# Patient Record
Sex: Female | Born: 1955 | Race: White | Hispanic: No | Marital: Married | Smoking: Former smoker
Health system: Southern US, Community
[De-identification: ages and names within clinical notes are randomized; demographics above are authoritative.]

## PROBLEM LIST (undated history)

## (undated) DIAGNOSIS — D649 Anemia, unspecified: Secondary | ICD-10-CM

## (undated) DIAGNOSIS — K625 Hemorrhage of anus and rectum: Secondary | ICD-10-CM

## (undated) DIAGNOSIS — E079 Disorder of thyroid, unspecified: Secondary | ICD-10-CM

## (undated) HISTORY — PX: BREAST LUMPECTOMY: SHX2

## (undated) HISTORY — DX: Hemorrhage of anus and rectum: K62.5

## (undated) HISTORY — PX: COLONOSCOPY: SHX174

## (undated) HISTORY — DX: Disorder of thyroid, unspecified: E07.9

## (undated) HISTORY — DX: Anemia, unspecified: D64.9

---

## 2011-07-14 ENCOUNTER — Encounter: Payer: Self-pay | Admitting: Internal Medicine

## 2011-08-09 ENCOUNTER — Encounter: Payer: Self-pay | Admitting: Internal Medicine

## 2011-08-09 ENCOUNTER — Ambulatory Visit (INDEPENDENT_AMBULATORY_CARE_PROVIDER_SITE_OTHER): Payer: Federal, State, Local not specified - PPO | Admitting: Internal Medicine

## 2011-08-09 VITALS — BP 98/60 | HR 70 | Ht 64.0 in | Wt 121.6 lb

## 2011-08-09 DIAGNOSIS — E039 Hypothyroidism, unspecified: Secondary | ICD-10-CM | POA: Insufficient documentation

## 2011-08-09 DIAGNOSIS — K5150 Left sided colitis without complications: Secondary | ICD-10-CM

## 2011-08-09 MED ORDER — MESALAMINE 1.2 G PO TBEC: 2400.0000 mg | DELAYED_RELEASE_TABLET | Freq: Every day | ORAL | Status: DC

## 2011-08-09 NOTE — Progress Notes (Signed)
Subjective:    Patient ID: Christine George, female    DOB: 03-Jul-1956, 55 y.o.   MRN: [id number]  HPI Christine George is a 55 year old female with a past medical history of proctosigmoiditis diagnosed in 1999 and hypothyroidism who presents for evaluation of bloody stools. Patient states that she feels she is having a flare of her ulcerative colitis which started around March or April 2012. She reports blood in mucus in her stool as well as increased fecal urgency and some tenesmus. She also has some rectal pain and pressure. She is having variable bowel movements as many as 3-4 a day to as many as none for 2 days. She's had no fever or chills. Her appetite has remained good. She's lost no weight.  She's not having any fevers or chills that she is aware of. Her nausea and vomiting. No heartburn, dysphagia, or odynophagia.  No eye pain or change in vision, no nonhealing skin rashes, no new joint pains, no mouth ulcers.  She reports her last GI procedure was in 2009 and that was approximately the time of her last flare (before the current one which started in March 2012).  In the past she has taken mesalamine with good symptom relief. She remembers both taking pills and using a rectal preparation.   Review of Systems Constitutional: Negative for fever, chills, night sweats, activity change, appetite change and unexpected weight change HEENT: Negative for sore throat, mouth sores and trouble swallowing. Eyes: Negative for visual disturbance Respiratory: Negative for cough, chest tightness and shortness of breath Cardiovascular: Negative for chest pain, palpitations and lower extremity swelling Gastrointestinal: See history of present illness Genitourinary: Negative for dysuria and hematuria. Musculoskeletal: Negative for back pain, arthralgias and myalgias Skin: Negative for rash or color change Neurological: Negative for headaches, weakness, numbness Hematological: Negative for adenopathy, negative for easy  bruising/bleeding Psychiatric/behavioral: Negative for depressed mood, negative for anxiety  Past Medical History  Diagnosis Date  . Rectal bleeding   . Anemia   . Thyroid disease   . Ulcerative colitis    Current Outpatient Prescriptions  Medication Sig Dispense Refill  . levothyroxine (SYNTHROID, LEVOTHROID) 125 MCG tablet Take 125 mcg by mouth once a week. Take one every 6 days       . mesalamine (LIALDA) 1.2 G EC tablet Take 2 tablets (2.4 g total) by mouth daily with breakfast.  60 tablet  2   No Known Allergies  Family History  Problem Relation Age of Onset  . Cirrhosis Father   . Heart disease Father   . Heart disease Brother   . Colon cancer Neg Hx     Social History  . Marital Status: Married   Social History Main Topics  . Smoking status: Former Smoker    Quit date: 06/29/1991  . Smokeless tobacco: None  . Alcohol Use: Yes     occassionally  . Drug Use: No      Objective:   Physical Exam BP 98/60  Pulse 70  Ht 5' 4"  (1.626 m)  Wt 121 lb 9.6 oz (55.157 kg)  BMI 20.87 kg/m2  LMP 01/26/2010 Constitutional: Well-developed and well-nourished. No distress. HEENT: Normocephalic and atraumatic. Oropharynx is clear and moist. No oropharyngeal exudate. Conjunctivae are normal. Pupils are equal round and reactive to light. No scleral icterus. Neck: Neck supple. Trachea midline. Cardiovascular: Normal rate, regular rhythm and intact distal pulses. No M/R/G Pulmonary/chest: Effort normal and breath sounds normal. No wheezing, rales or rhonchi. Abdominal: Soft, nontender, nondistended. Bowel sounds active  throughout. There are no masses palpable. No hepatosplenomegaly. Lymphadenopathy: No cervical adenopathy noted. Neurological: Alert and oriented to person place and time. Skin: Skin is warm and dry. No rashes noted. Psychiatric: Normal mood and affect. Behavior is normal.  Prior Procedures: Colonoscopy 08/01/2008 --The mucosa appeared normal from the cecum to  the mid sigmoid. Distal to about 25 cm there was granularity and friability consistent with mild to moderate ALT of colitis. Biopsies were taken in this area. The scope was withdrawn.  Colonoscopy 03/23/1998 The mucosa and vascular pattern appeared normal from the cecum to the rectosigmoid junction. In the distal rectum there was mild to moderate proctitis with granularity, somewhat friable mucosa. There were no gross ulcerations noted. Multiple biopsies were taken.  Pathology report 01/11/2007 Superficial mucosal lymphoplasmacytic aggregates, lymphoid follicles and focal acute inflammation in rectal mucosa, associated with mild crypt architectural irregularity.  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 No organisms, granulomas or viral inclusions are seen. No dysplasia is identified.    Assessment & Plan:  This is a 55 year old female with a past medical history of proctosigmoiditis diagnosed in 1999, and hypothyroidism presenting with symptoms consistent with a flare of her known left-sided colitis.  1. U.C./proctosigmoiditis - overall the patient has mild disease as evidenced by her relatively long gaps without flare and the relative mildness of her current symptoms. Her symptoms are consistent with active disease and therefore I will restart her on mesalamine. We discussed oral versus PR enemas and for now she will prefer oral therapy. She is planning a trip to [place] in early October and would prefer oral therapy for this trip. We may discuss rectal therapy in the future. For now I will start her on Lialda 2.4 g daily (one dose).  I will see her back in 6 weeks to assess response. She should call me should symptoms worsen prior to this visit.  Regarding colorectal cancer surveillance, given that her disease is left-sided the guidelines support beginning surveillance every one to 2 years at  approximately the 15 year mark. For her this would be 2014. For now we will plan surveillance colonoscopy for 2014

## 2011-08-09 NOTE — Patient Instructions (Signed)
Lialda has been sent to your pharmacy. Please follow up in 6-8 weeks

## 2011-08-16 ENCOUNTER — Telehealth: Payer: Self-pay | Admitting: Internal Medicine

## 2011-08-16 MED ORDER — MESALAMINE 4 G RE ENEM: 4.0000 g | ENEMA | Freq: Every day | RECTAL | Status: DC

## 2011-08-16 NOTE — Telephone Encounter (Signed)
lmom for pt to call back

## 2011-08-16 NOTE — Telephone Encounter (Signed)
Informed pt of Dr Vena Rua suggestions. She reports she is already on Lialda 4.8 grams daily- he gave her samples. She agreed to try the enemas for a week and will call back with an update.

## 2011-08-16 NOTE — Telephone Encounter (Signed)
Pt saw Dr Hilarie Fredrickson on 08/09/11 for colitis and was started on Lialda 2.4G daily. Dr Hilarie Fredrickson discussed enemas or po meds and because pt is going out of the country in October, she decided on PO was best. Pt called today to report she has seen "some" improvement, but she leaves soon- a week from Monday- and wants to be better. She reports she still has watery stools that are bloody and urgency- but some improvement. She has an increase in gas also. Please advise.

## 2011-08-16 NOTE — Telephone Encounter (Signed)
Addended by: Lance Morin on: 08/16/2011 03:25 PM   Modules accepted: Orders

## 2011-08-16 NOTE — Telephone Encounter (Signed)
Increase her to 4.8 grams daily. Also, can offer rowasa enemas 4 grams qHS...retain overnight. Would try this to see if she improves further.  The topical rectal therapy might be better at reaching the distal rectum. Finally, if still no improvement could start prednisone taper, but this would be over 6-8 weeks.  Would try increasing lialda and adding rowasa 1st. She should call back if this doesn't work to improve rectal symptoms.

## 2011-08-29 ENCOUNTER — Telehealth: Payer: Self-pay | Admitting: Internal Medicine

## 2011-08-29 MED ORDER — MESALAMINE 4 G RE ENEM: 4.0000 g | ENEMA | Freq: Every day | RECTAL | Status: DC

## 2011-08-29 NOTE — Telephone Encounter (Signed)
Pt called to report she completed the Rowasa enemas last Thursday and her symptoms came back .Pt requests  A refill on the Rowasa just to get her through 09/06/11 when returns from her trip. Informed pt Dr Hilarie Fredrickson she can remain on the Rowasa prn and when she returns she can try to stop the Lialda and remain on the Enemas if necessary. Pt stated understanding.

## 2011-09-15 ENCOUNTER — Encounter: Payer: Self-pay | Admitting: Internal Medicine

## 2011-09-22 ENCOUNTER — Other Ambulatory Visit (INDEPENDENT_AMBULATORY_CARE_PROVIDER_SITE_OTHER): Payer: Federal, State, Local not specified - PPO

## 2011-09-22 ENCOUNTER — Ambulatory Visit (INDEPENDENT_AMBULATORY_CARE_PROVIDER_SITE_OTHER): Payer: Federal, State, Local not specified - PPO | Admitting: Internal Medicine

## 2011-09-22 ENCOUNTER — Ambulatory Visit: Payer: Federal, State, Local not specified - PPO | Admitting: Internal Medicine

## 2011-09-22 ENCOUNTER — Encounter: Payer: Self-pay | Admitting: Internal Medicine

## 2011-09-22 VITALS — BP 100/56 | HR 64 | Ht 63.75 in | Wt 120.8 lb

## 2011-09-22 DIAGNOSIS — K5190 Ulcerative colitis, unspecified, without complications: Secondary | ICD-10-CM

## 2011-09-22 LAB — COMPREHENSIVE METABOLIC PANEL
AST: 23 U/L (ref 0–37)
Alkaline Phosphatase: 71 U/L (ref 39–117)
BUN: 13 mg/dL (ref 6–23)
Glucose, Bld: 77 mg/dL (ref 70–99)
Total Bilirubin: 0.6 mg/dL (ref 0.3–1.2)

## 2011-09-22 LAB — CBC WITH DIFFERENTIAL/PLATELET
Basophils Relative: 0.8 % (ref 0.0–3.0)
Eosinophils Absolute: 0.2 10*3/uL (ref 0.0–0.7)
Eosinophils Relative: 3.6 % (ref 0.0–5.0)
HCT: 30.3 % — ABNORMAL LOW (ref 36.0–46.0)
Lymphs Abs: 1.6 10*3/uL (ref 0.7–4.0)
MCHC: 33 g/dL (ref 30.0–36.0)
MCV: 79.7 fl (ref 78.0–100.0)
Monocytes Absolute: 0.6 10*3/uL (ref 0.1–1.0)
Platelets: 368 10*3/uL (ref 150.0–400.0)
RBC: 3.8 Mil/uL — ABNORMAL LOW (ref 3.87–5.11)
WBC: 6 10*3/uL (ref 4.5–10.5)

## 2011-09-22 LAB — HIGH SENSITIVITY CRP: CRP, High Sensitivity: 0.82 mg/L (ref 0.000–5.000)

## 2011-09-22 LAB — SEDIMENTATION RATE: Sed Rate: 42 mm/hr — ABNORMAL HIGH (ref 0–22)

## 2011-09-22 MED ORDER — MESALAMINE 4 G RE ENEM: 4.0000 g | ENEMA | Freq: Every day | RECTAL | Status: DC

## 2011-09-22 NOTE — Progress Notes (Signed)
Subjective:    Patient ID: Christine George, female    DOB: 1956-08-05, 55 y.o.   MRN: [id number]  HPI 55 yo female with hx of protosigmoiditis since 1999 here for follow-up.  The patient was last seen in September 2012 at that time was having symptoms consistent with UC flare. She was having rectal pain, tenesmus and bloody stools. At that time she was started on Lialda 2.4 g daily as well as Rowasa enemas each bedtime. Initially she had no real improvement with the Lialda, and thus Rowasa was added. The Rowasa seemed to work initially, but then she stopped the medication after about a week. She then traveled to [place], and while there she used Lialda and Rowasa, but this time the Rowasa did not seem to work very well. Thus since around October 7 she has remained only on Lialda 2.4 g daily.  At present she continues to still have rectal "aching", rectal bleeding with her bowel movements, fecal urgency and tenesmus. She reports one to 2 bowel movements on most days. No diarrhea. No anterior abdominal pain or upper symptoms, including no nausea/vomiting. No change in her appetite and no weight loss. No fevers or chills.   Review of Systems Constitutional: Negative for fever, chills, night sweats, activity change, appetite change and unexpected weight change HEENT: Negative for sore throat, mouth sores and trouble swallowing. Eyes: Negative for visual disturbance Respiratory: Negative for cough, chest tightness and shortness of breath Cardiovascular: Negative for chest pain, palpitations and lower extremity swelling Gastrointestinal: See history of present illness Genitourinary: Negative for dysuria and hematuria. Musculoskeletal: Negative for back pain, arthralgias and myalgias Skin: Negative for rash or color change Neurological: Negative for headaches, weakness, numbness Hematological: Negative for adenopathy, negative for easy bruising/bleeding Psychiatric/behavioral: Negative for depressed mood,  negative for anxiety  Patient Active Problem List  Diagnoses  . Ulcerative colitis, left sided  . Hypothyroidism   Current Outpatient Prescriptions  Medication Sig Dispense Refill  . levothyroxine (SYNTHROID, LEVOTHROID) 125 MCG tablet Take 125 mcg by mouth once a week. Take one every 6 days       . mesalamine (ROWASA) 4 G enema Place 60 mLs (4 g total) rectally daily.  30 Bottle  2   No Known Allergies  Family History  Problem Relation Age of Onset  . Cirrhosis Father   . Heart disease Father   . Heart disease Brother   . Colon cancer Neg Hx    SH -- reviewed and no change     Objective:   Physical Exam BP 100/56  Pulse 64  Ht 5' 3.75" (1.619 m)  Wt 120 lb 12.8 oz (54.795 kg)  BMI 20.90 kg/m2  LMP 01/26/2010 Constitutional: Well-developed and well-nourished. No distress. HEENT: Normocephalic and atraumatic. Oropharynx is clear and moist. No oropharyngeal exudate. Conjunctivae are normal. Pupils are equal round and reactive to light. No scleral icterus. Neck: Neck supple. Trachea midline. Cardiovascular: Normal rate, regular rhythm and intact distal pulses. No M/R/G Pulmonary/chest: Effort normal and breath sounds normal. No wheezing, rales or rhonchi. Abdominal: Soft, mild lower abdominal tenderness without rebound or guarding, nondistended. Bowel sounds active throughout. There are no masses palpable. No hepatosplenomegaly. Extremities: no clubbing, cyanosis, or edema Lymphadenopathy: No cervical adenopathy noted. Neurological: Alert and oriented to person place and time. Skin: Skin is warm and dry. No rashes noted. Psychiatric: Normal mood and affect. Behavior is normal.     Assessment & Plan:  This is a 55 year old female with a past medical history of  left-sided ulcers colitis (proctosigmoiditis) presenting for followup with ongoing symptoms of active disease.  1. UC -- in the past the patient's disease has been limited to the left colon (25 cm and distally). She  is not getting much benefit from the oral mesalamine preparation, but did seem to respond to the enema formulation of the medication. I do not think she gave the medication long enough to provide an adequate response.  We have discussed her current flare and options for treatment. We also discussed a prednisone taper to induce remission, but she is hesitant to do this at this time due to possible side effects of this medication. We also briefly discussed other potential therapies for maintenance including immunomodulators therapy and biologic therapy. At this time I do not think her symptoms are severe enough to warrant an escalation to these medications.  For now I will d/c Lialda and restart Rowasa 4 g each bedtime. I would like for her to do this consistently for at least a month before we determine that it is incompletely effective/ineffective.  She will call the office in 2 or 3 weeks to update [place] on her symptoms. If she remains symptomatic I have recommended flexible sigmoidoscopy to assess the degree/severity of her colitis. If she fails to respond to 5-ASA alone, then she will likely need prednisone taper. I will schedule clinic followup for 6 weeks, with the possibility of flexible sigmoidoscopy sooner.

## 2011-09-22 NOTE — Patient Instructions (Signed)
Please go downstairs to the basement after your appointment to have your labs drawn. Your prescription has been sent to your pharmacy - take as directed and discontinue the Lialda. Call Caren Griffins in 2-3 weeks to let her know if you have improved. If so, follow up in 6 weeks.

## 2011-09-26 ENCOUNTER — Telehealth: Payer: Self-pay | Admitting: *Deleted

## 2011-09-26 NOTE — Telephone Encounter (Signed)
Message copied by Lance Morin on Mon Sep 26, 2011 10:47 AM ------      Message from: Jerene Bears      Created: Thu Sep 22, 2011  1:31 PM       Please inform patient of lab results.      Patient has a mild anemia, which is likely related to her rectal bleeding. Hemoglobin is not a dangerous level but we will follow this. Hopefully with treatment of her colitis her bleeding will stop and her blood counts will normalize.      Her ESR is elevated and this is consistent with ongoing inflammation. I discussed with her this can be used to follow her disease activity. We will proceed with plan as discussed today in clinic

## 2011-09-26 NOTE — Telephone Encounter (Signed)
Notified pt of Dr Vena Rua findings. Pt stated understanding.

## 2011-10-31 ENCOUNTER — Encounter: Payer: Self-pay | Admitting: Internal Medicine

## 2011-10-31 ENCOUNTER — Telehealth: Payer: Self-pay | Admitting: Internal Medicine

## 2011-10-31 NOTE — Telephone Encounter (Signed)
Pt has been on Rowasa for a month and is no better.  She does not want to have a colon or flex.  She says Dr Hilarie Fredrickson spoke with her about prednisone and would like to try this first.  Is this ok?

## 2011-10-31 NOTE — Telephone Encounter (Signed)
Patty I really think she needs a flex sig to assess severity given her lack of response to 5-asa.  I have never performed a procedure on her and therefore need to be sure of what we are dealing with. Pred may in fact be what she needs, but I think flex is 1st step (can be sedated or unsedated).  Reg prep. Thanks.

## 2011-10-31 NOTE — Telephone Encounter (Signed)
Pt aware previst and flex scheduled

## 2011-10-31 NOTE — Telephone Encounter (Signed)
Addendum to route to Christian Mate.

## 2011-11-08 ENCOUNTER — Encounter: Payer: Self-pay | Admitting: Internal Medicine

## 2011-11-08 ENCOUNTER — Ambulatory Visit (AMBULATORY_SURGERY_CENTER): Payer: Federal, State, Local not specified - PPO

## 2011-11-08 VITALS — Ht 64.0 in | Wt 125.2 lb

## 2011-11-08 DIAGNOSIS — K5190 Ulcerative colitis, unspecified, without complications: Secondary | ICD-10-CM

## 2011-11-11 ENCOUNTER — Ambulatory Visit (AMBULATORY_SURGERY_CENTER): Payer: Federal, State, Local not specified - PPO | Admitting: Internal Medicine

## 2011-11-11 ENCOUNTER — Other Ambulatory Visit (INDEPENDENT_AMBULATORY_CARE_PROVIDER_SITE_OTHER): Payer: Federal, State, Local not specified - PPO

## 2011-11-11 ENCOUNTER — Encounter: Payer: Self-pay | Admitting: Internal Medicine

## 2011-11-11 ENCOUNTER — Other Ambulatory Visit: Payer: Self-pay | Admitting: Internal Medicine

## 2011-11-11 DIAGNOSIS — K5190 Ulcerative colitis, unspecified, without complications: Secondary | ICD-10-CM

## 2011-11-11 DIAGNOSIS — E039 Hypothyroidism, unspecified: Secondary | ICD-10-CM

## 2011-11-11 DIAGNOSIS — K529 Noninfective gastroenteritis and colitis, unspecified: Secondary | ICD-10-CM

## 2011-11-11 DIAGNOSIS — K5289 Other specified noninfective gastroenteritis and colitis: Secondary | ICD-10-CM

## 2011-11-11 LAB — IBC PANEL
Iron: 32 ug/dL — ABNORMAL LOW (ref 42–145)
Saturation Ratios: 7 % — ABNORMAL LOW (ref 20.0–50.0)
Transferrin: 327.1 mg/dL (ref 212.0–360.0)

## 2011-11-11 LAB — CBC WITH DIFFERENTIAL/PLATELET
Basophils Absolute: 0 10*3/uL (ref 0.0–0.1)
Eosinophils Absolute: 0.1 10*3/uL (ref 0.0–0.7)
Hemoglobin: 10 g/dL — ABNORMAL LOW (ref 12.0–15.0)
Lymphocytes Relative: 22.5 % (ref 12.0–46.0)
Lymphs Abs: 1.5 10*3/uL (ref 0.7–4.0)
MCHC: 33.2 g/dL (ref 30.0–36.0)
Neutro Abs: 4.6 10*3/uL (ref 1.4–7.7)
Platelets: 296 10*3/uL (ref 150.0–400.0)
RDW: 16 % — ABNORMAL HIGH (ref 11.5–14.6)

## 2011-11-11 LAB — VITAMIN B12: Vitamin B-12: 589 pg/mL (ref 211–911)

## 2011-11-11 LAB — FOLATE: Folate: >24.8 ng/mL (ref >5.9)

## 2011-11-11 MED ORDER — MESALAMINE 800 MG PO TBEC: 2.0000 | DELAYED_RELEASE_TABLET | Freq: Three times a day (TID) | ORAL | Status: DC

## 2011-11-11 MED ORDER — SODIUM CHLORIDE 0.9 % IV SOLN: 500.0000 mL | INTRAVENOUS | Status: DC

## 2011-11-11 MED ORDER — MESALAMINE 400 MG PO TBEC: 1600.0000 mg | DELAYED_RELEASE_TABLET | Freq: Three times a day (TID) | ORAL | Status: DC

## 2011-11-11 MED ORDER — PREDNISONE 10 MG PO TABS: ORAL_TABLET | ORAL | Status: DC

## 2011-11-11 NOTE — Progress Notes (Signed)
Patient did not experience any of the following events: a burn prior to discharge; a fall within the facility; wrong site/side/patient/procedure/implant event; or a hospital transfer or hospital admission upon discharge from the facility. (G8907) Patient did not have preoperative order for IV antibiotic SSI prophylaxis. (G8918)  

## 2011-11-11 NOTE — Op Note (Signed)
[facility]. [place],   [id number]  FLEXIBLE SIGMOIDOSCOPY PROCEDURE REPORT  PATIENT:  Christine, George  MR#:  [id number] BIRTHDATE:  1956-04-02, 55 yrs. old  GENDER:  female ENDOSCOPIST:  Lajuan Lines. Kaylie Ritter, MD  PROCEDURE DATE:  11/11/2011 PROCEDURE:  Flexible Sigmoidoscopy with biopsy ASA CLASS:  Class I INDICATIONS:  assess known proctosigmoiditis (UC), rectal bleeding  MEDICATIONS:   These medications were titrated to patient response per physician's verbal order, Versed 6 mg IV, Fentanyl 50 mcg IV  DESCRIPTION OF PROCEDURE:   After the risks benefits and alternatives of the procedure were thoroughly explained, informed consent was obtained.  Digital rectal exam was performed and revealed no rectal masses.   The [id number] endoscope was introduced through the anus and advanced to the descending colon, without limitations.  The quality of the prep was adequate. The instrument was then slowly withdrawn as the mucosa was fully examined. <<PROCEDUREIMAGES>>  Moderate colitis, characterized by erythema, friability, granularity, superficial ulceration, and loss of normal vascular pattern was found rectum to extending to the descending colon. The inflammation was most notable in the rectum and sigmoid colon, while being mild in the descending colon. Multiple biopsies were obtained and sent to pathology.   Retroflexed views in the rectum was not performed due to inflammation.  The scope was then withdrawn from the patient and the procedure terminated.  COMPLICATIONS:  None  ENDOSCOPIC IMPRESSION: 1) Colitis in the rectum to descending colon (entire examined colon).  Multiple biopsies performed.  RECOMMENDATIONS: 1) Await biopsy results 2) Begin prednisone 40 mg daily for 1 week, then taper by 5 mg every 7 days thereafter. 3) Given extent of inflammation, I recommend discontinuing Rowasa enemas, and starting Asacol (mesalamine) 1600 mg TID. 4)  Clinic followup in 1 month to assess response/symptoms.  Lajuan Lines. Landrum Carbonell, MD  CC:  The Patient  n. eSIGNED:   Lajuan Lines. Katelen Luepke at 11/11/2011 02:32 PM  [name], [phone]

## 2011-11-11 NOTE — Patient Instructions (Signed)
Please refer to your blue and neon green sheets for instructions regarding diet and activity for the rest of today.  You may resume your medications as you would normally take them.  

## 2011-11-14 ENCOUNTER — Encounter: Payer: Self-pay | Admitting: *Deleted

## 2011-11-14 ENCOUNTER — Telehealth: Payer: Self-pay

## 2011-11-14 NOTE — Telephone Encounter (Signed)
Left message on answering machine. 

## 2011-11-15 ENCOUNTER — Telehealth: Payer: Self-pay | Admitting: *Deleted

## 2011-11-15 MED ORDER — INTEGRA PLUS PO CAPS: 1.0000 | ORAL_CAPSULE | Freq: Every day | ORAL | Status: DC

## 2011-11-15 NOTE — Telephone Encounter (Signed)
Message copied by Lance Morin on Tue Nov 15, 2011 10:31 AM ------      Message from: Jerene Bears      Created: Tue Nov 15, 2011  8:23 AM       Pt has mild anemia.  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      Would rec Integra x 12 weeks.

## 2011-11-15 NOTE — Telephone Encounter (Signed)
lmom for pt to call back. Ordered Integra Plus from [facility].

## 2011-11-15 NOTE — Telephone Encounter (Signed)
Informed pt I ordered the Integra for her low Iron stores per Dr Hilarie Fredrickson. She stated the Asacol co pay was too high- $250/month. Mailed pt a discount card for Asacol HD and Lialda in case it is cheaper.

## 2011-11-18 ENCOUNTER — Encounter: Payer: Self-pay | Admitting: Internal Medicine

## 2011-12-02 ENCOUNTER — Encounter: Payer: Self-pay | Admitting: Internal Medicine

## 2011-12-13 ENCOUNTER — Encounter: Payer: Self-pay | Admitting: Internal Medicine

## 2011-12-13 ENCOUNTER — Ambulatory Visit (INDEPENDENT_AMBULATORY_CARE_PROVIDER_SITE_OTHER): Payer: Federal, State, Local not specified - PPO | Admitting: Internal Medicine

## 2011-12-13 VITALS — BP 104/64 | HR 70 | Ht 63.6 in | Wt 124.0 lb

## 2011-12-13 DIAGNOSIS — K5190 Ulcerative colitis, unspecified, without complications: Secondary | ICD-10-CM

## 2011-12-13 MED ORDER — MESALAMINE ER 0.375 G PO CP24: 375.0000 mg | ORAL_CAPSULE | Freq: Every day | ORAL | Status: DC

## 2011-12-13 NOTE — Progress Notes (Signed)
Subjective:    Patient ID: Christine George, female    DOB: 1956-03-12, 56 y.o.   MRN: [id number]  HPI 56 yo female with PMH of UC and hypothyroidism who presents for follow-up of UC.  [name] presents alone today. She reports dramatic improvement in her colitis symptoms since starting the prednisone taper in mid December. In December she underwent a flexible sigmoidoscopy which revealed left-sided mild to moderate ulcerative colitis, extending to the splenic flexure. 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  She failed to respond to topical mesalamine preparations alone both oral and PR. She is currently weaned down the prednisone 20 mg daily. Today she reports resolution of her bloody diarrhea. No abdominal pain. No more mucus in her stool. No further rectal pain or tenesmus. In fact, she reports she has been somewhat constipated and has been using senna when necessary. She was prescribed oral iron replacement given her iron deficiency, and she decreased this to every other day given her constipation. She reports that she's only been using Asacol HD 1.6 grams qHS rather than TID.  She simply states she was just confused as to how she was supposed to take this. She has noted some difficulty in paying for this medication. She has recently started an exercise program and is eating a balanced diet. Overall she is feeling very well today. No fevers or chills.  Review of Systems Constitutional: Negative for fever, chills, night sweats, activity change, appetite change and unexpected weight change HEENT: Negative for sore throat, mouth sores and trouble swallowing. Eyes: Negative for visual disturbance Respiratory: Negative for cough, chest tightness and shortness of breath Cardiovascular: Negative for chest pain, palpitations and lower extremity swelling Gastrointestinal: See history of present illness Genitourinary: Negative for dysuria and  hematuria. Musculoskeletal: Negative for back pain, arthralgias and myalgias Skin: Negative for rash or color change Neurological: Negative for headaches, weakness, numbness Hematological: Negative for adenopathy, negative for easy bruising/bleeding Psychiatric/behavioral: Negative for depressed mood, negative for anxiety  Patient Active Problem List  Diagnoses  . Ulcerative colitis, left sided  . Hypothyroidism   Past Surgical History  Procedure Date  . Breast lumpectomy     right  . Cesarean section   . Colonoscopy    Current Outpatient Prescriptions  Medication Sig Dispense Refill  . calcium carbonate 200 MG capsule Take 250 mg by mouth 2 (two) times daily with a meal.        . cholecalciferol (VITAMIN D) 400 UNITS TABS Take by mouth daily.        Marland Kitchen FeFum-FePoly-FA-B Cmp-C-Biot (INTEGRA PLUS) CAPS Take 1 capsule by mouth daily.  30 capsule  4  . levothyroxine (SYNTHROID, LEVOTHROID) 125 MCG tablet Take 125 mcg by mouth. Take one every 6 days a week      . Multiple Vitamin (MULTIVITAMIN) tablet Take 1 tablet by mouth daily.        . predniSONE (DELTASONE) 10 MG tablet 40 mg x 7 d, then 35 mg x 7 d, then 30 mg x 7 d, then 25 mg x 7 d, then 20 mg x 7 d, then 15 mg x 7 d, then 10 mg x 7 d, then 5 mg x 7 d  130 tablet  0  . valACYclovir (VALTREX) 1000 MG tablet       . mesalamine (APRISO) 0.375 G 24 hr capsule Take 1 capsule (0.375 g total) by mouth daily.  120 capsule  5   No Known  Allergies  Family History  Problem Relation Age of Onset  . Cirrhosis Father   . Heart disease Father   . Heart disease Brother   . Colon cancer Neg Hx    SH - reviewed and no change    Objective:   Physical Exam BP 104/64  Pulse 70  Ht 5' 3.6" (1.615 m)  Wt 124 lb (56.246 kg)  BMI 21.55 kg/m2  LMP 01/26/2010 Constitutional: Well-developed and well-nourished. No distress. HEENT: Normocephalic and atraumatic. Oropharynx is clear and moist. No oropharyngeal exudate. Conjunctivae are normal.  Pupils are equal round and reactive to light. No scleral icterus. Cardiovascular: Normal rate, regular rhythm and intact distal pulses. No M/R/G Pulmonary/chest: Effort normal and breath sounds normal. No wheezing, rales or rhonchi. Abdominal: Soft, nontender, nondistended. Bowel sounds active throughout. There are no masses palpable. No hepatosplenomegaly. Extremities: no clubbing, cyanosis, or edema Neurological: Alert and oriented to person place and time. Skin: Skin is warm and dry. No rashes noted. Psychiatric: Normal mood and affect. Behavior is normal.  CBC    Component Value Date/Time   WBC 6.8 11/11/2011 1524   RBC 3.87 11/11/2011 1524   HGB 10.0* 11/11/2011 1524   HCT 30.1* 11/11/2011 1524   PLT 296.0 11/11/2011 1524   MCV 77.7* 11/11/2011 1524   MCHC 33.2 11/11/2011 1524   RDW 16.0* 11/11/2011 1524   LYMPHSABS 1.5 11/11/2011 1524   MONOABS 0.6 11/11/2011 1524   EOSABS 0.1 11/11/2011 1524   BASOSABS 0.0 11/11/2011 1524   Ferritin 5.7, % sat and iron levels low (Dec 2012)    Assessment & Plan:  This is a 57 year old female with left-sided ulcerative colitis which was unresponsive to 5-ASA alone requiring prednisone taper.  1. Left-sided UC -- for now it appears that she is in complete clinical remission on steroids. She has been able to wean down successfully to 20 mg daily, and she will continue to do so by 5 mg every 7 days until she is off. At present she is not taking her mesalamine completely correctly, and I would like for her to increase to Asacol 800 mg TID.  She will complete the prescription that she currently has, and I will change her to Apriso 4 tablets daily.  This medication is indicated for maintenance of remission and also colitis, and therefore she will start this now. We have discussed how I am unsure if after weaning from steroids, she will be able to maintain remission with mesalamine alone. If not, she will need additional maintenance medication. We  briefly discussed the immunomodulator therapy and biologic therapy today. If she does indeed need additional medication to maintain remission, my next option will likely be azathioprine. She continues to feel hopeful that God will help maintain remission for her, and at present she would not like to start immunomodulator therapy.  If her symptoms flare or return after she is weaned off prednisone, then we will initiate/escalate therapy.  I would like for her to continue on her oral iron replacement therapy. I will see her back in 3 months time, or sooner if needed. At followup I would like to check her blood counts and iron stores.  If azathioprine becomes necessary, she will need a TPMT level.

## 2011-12-13 NOTE — Patient Instructions (Addendum)
Dr. Hilarie Fredrickson would like you to complete taking Azocol then switch to Apriso.  Stop taking Senna and switch to Miralax OTC  Dr. Hilarie Fredrickson would like to see you back in the office in 3 months for a follow up.

## 2012-05-23 ENCOUNTER — Encounter: Payer: Self-pay | Admitting: Internal Medicine

## 2012-08-16 ENCOUNTER — Encounter: Payer: Self-pay | Admitting: Internal Medicine

## 2012-08-17 ENCOUNTER — Encounter: Payer: Self-pay | Admitting: Internal Medicine

## 2012-08-17 ENCOUNTER — Ambulatory Visit (INDEPENDENT_AMBULATORY_CARE_PROVIDER_SITE_OTHER): Payer: Federal, State, Local not specified - PPO | Admitting: Internal Medicine

## 2012-08-17 ENCOUNTER — Other Ambulatory Visit (INDEPENDENT_AMBULATORY_CARE_PROVIDER_SITE_OTHER): Payer: Federal, State, Local not specified - PPO

## 2012-08-17 VITALS — BP 84/56 | HR 64 | Ht 63.75 in | Wt 124.1 lb

## 2012-08-17 DIAGNOSIS — K5190 Ulcerative colitis, unspecified, without complications: Secondary | ICD-10-CM

## 2012-08-17 DIAGNOSIS — E611 Iron deficiency: Secondary | ICD-10-CM | POA: Insufficient documentation

## 2012-08-17 DIAGNOSIS — D509 Iron deficiency anemia, unspecified: Secondary | ICD-10-CM

## 2012-08-17 LAB — CBC
Hemoglobin: 12.3 g/dL (ref 12.0–15.0)
MCHC: 33.3 g/dL (ref 30.0–36.0)
MCV: 91.2 fl (ref 78.0–100.0)
Platelets: 240 10*3/uL (ref 150.0–400.0)
RBC: 4.05 Mil/uL (ref 3.87–5.11)

## 2012-08-17 LAB — IBC PANEL: Saturation Ratios: 19.2 % — ABNORMAL LOW (ref 20.0–50.0)

## 2012-08-17 MED ORDER — MESALAMINE ER 0.375 G PO CP24: 375.0000 mg | ORAL_CAPSULE | Freq: Every day | ORAL | Status: DC

## 2012-08-17 NOTE — Progress Notes (Signed)
  Subjective:    Patient ID: Christine George, female    DOB: 07-27-1956, 56 y.o.   MRN: [id number]  HPI Christine George is a 56 yo female with PMH of UC and hypothyroidism who presents for follow-up of UC. Christine George presents alone today. She was last seen in January 2013.  Since that time she's been doing very well without recurrent flare of her left-sided colitis. She has been maintained on Apriso 4 capsules daily.  She reports she takes these almost every day, but admits to missing them on occasion. She says she takes them roughly every day to day and a half.  She is having formed brown stool with no rectal bleeding. No diarrhea. No abdominal pain. No fevers or chills. No rectal pain or tenesmus.she did take iron for one to 2 months but has stopped. Energy level has been good. Since last seeing me, her mother died, and she has been working to deal with this.  Review of Systems As per history of present illness, otherwise negative  Current Medications, Allergies, Past Medical History, Past Surgical History, Family History and Social History were reviewed in [facility] record.    Objective:   Physical Exam BP 84/56  Pulse 64  Ht 5' 3.75" (1.619 m)  Wt 124 lb 2 oz (56.303 kg)  BMI 21.47 kg/m2  LMP 01/26/2010 Constitutional: Well-developed and well-nourished. No distress. HEENT: Normocephalic and atraumatic. Oropharynx is clear and moist. No oropharyngeal exudate. Conjunctivae are normal.  No scleral icterus.. Cardiovascular: Normal rate, regular rhythm and intact distal pulses. No M/R/G Pulmonary/chest: Effort normal and breath sounds normal. No wheezing, rales or rhonchi. Abdominal: Soft, nontender, nondistended. Bowel sounds active throughout. There are no masses palpable. No hepatosplenomegaly. Extremities: no clubbing, cyanosis, or edema Neurological: Alert and oriented to person place and time. Skin: Skin is warm and dry. No rashes noted. Psychiatric: Normal mood and affect.  Behavior is normal.  Labs -- iron studies and CBC ordered today    Assessment & Plan:   56 yo female with PMH of UC and hypothyroidism who presents for follow-up of left-sided UC.  1. Left-sided ulcerative colitis -- she has done well on her mesalamine preparation without flare in nearly 8 months. She has only needed steroids once since establishing care with me. We had previously discussed azathioprine or biologic therapy, but at this time given her response to 5-ASA, I do not think this is necessary. For now we'll continue mesalamine at her current dose, and I've encouraged her to try to take this daily. She will notify us should she develop signs or symptoms of active disease. Otherwise I'll see her back in about one year.  2.  Iron def anemia -- she had taken oral iron for a short period, and hopefully now that her colitis is under control her anemia and iron deficiency will improve. We will check CBC and iron stores today, and if still low plan oral iron supplementation.

## 2012-08-17 NOTE — Patient Instructions (Addendum)
We have sent the following medications to your pharmacy for you to pick up at your convenience: [facility] has requested that you go to the basement for the following lab work before leaving today:  CBC, Ferritin, IBC

## 2012-12-24 ENCOUNTER — Encounter: Payer: Self-pay | Admitting: Internal Medicine

## 2013-01-03 ENCOUNTER — Encounter: Payer: Self-pay | Admitting: Internal Medicine

## 2013-01-08 ENCOUNTER — Other Ambulatory Visit (INDEPENDENT_AMBULATORY_CARE_PROVIDER_SITE_OTHER): Payer: Federal, State, Local not specified - PPO

## 2013-01-08 ENCOUNTER — Encounter: Payer: Self-pay | Admitting: Internal Medicine

## 2013-01-08 ENCOUNTER — Ambulatory Visit (INDEPENDENT_AMBULATORY_CARE_PROVIDER_SITE_OTHER): Payer: Federal, State, Local not specified - PPO | Admitting: Internal Medicine

## 2013-01-08 VITALS — BP 116/62 | HR 82 | Ht 63.75 in | Wt 128.0 lb

## 2013-01-08 DIAGNOSIS — K5190 Ulcerative colitis, unspecified, without complications: Secondary | ICD-10-CM

## 2013-01-08 DIAGNOSIS — K5150 Left sided colitis without complications: Secondary | ICD-10-CM

## 2013-01-08 LAB — CBC
HCT: 37.4 % (ref 36.0–46.0)
MCV: 87.9 fl (ref 78.0–100.0)
Platelets: 256 10*3/uL (ref 150.0–400.0)
RBC: 4.25 Mil/uL (ref 3.87–5.11)
WBC: 6.6 10*3/uL (ref 4.5–10.5)

## 2013-01-08 LAB — BASIC METABOLIC PANEL
CO2: 30 mEq/L (ref 19–32)
Chloride: 103 mEq/L (ref 96–112)
Potassium: 4 mEq/L (ref 3.5–5.1)
Sodium: 139 mEq/L (ref 135–145)

## 2013-01-08 MED ORDER — MESALAMINE ER 0.375 G PO CP24: 1.5000 g | ORAL_CAPSULE | Freq: Every day | ORAL | Status: DC

## 2013-01-08 NOTE — Progress Notes (Signed)
Patient ID: Christine George, female   DOB: [birth year], 57 y.o.   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 She is alone today. She was last seen in September 2013.  She has been doing well without symptoms of colitis.  She was also doing well her last visit. She has been using Apriso 4 tablets about twice weekly. His medication as prescribed daily, but she feels like she doesn't need it that frequently. She is eating well. Bowel movements have been normal without blood or melena. She's having a bowel movement once per day on average, occasionally every other day. They are formed without diarrhea. No abdominal pain. No nausea or vomiting. Good appetite. Stable weight. No rashes, mouth ulcers, joint complaints or eye complaints. No fevers or chills  Review of Systems  As per history of present illness, otherwise negative   Past Medical History  Diagnosis Date  . Rectal bleeding   . Anemia   . Thyroid disease   . Ulcerative colitis     Current Outpatient Prescriptions  Medication Sig Dispense Refill  . calcium carbonate 200 MG capsule Take 250 mg by mouth 2 (two) times daily with a meal.        . cholecalciferol (VITAMIN D) 400 UNITS TABS Take by mouth daily.        Marland Kitchen levothyroxine (SYNTHROID, LEVOTHROID) 112 MCG tablet Take 112 mcg by mouth daily.      . mesalamine (APRISO) 0.375 G 24 hr capsule Take 4 capsules (1.5 g total) by mouth daily.  120 capsule  11  . Multiple Vitamin (MULTIVITAMIN) tablet Take 1 tablet by mouth daily.        . valACYclovir (VALTREX) 1000 MG tablet        No current facility-administered medications for this visit.    No Known Allergies  Family History  Problem Relation Age of Onset  . Cirrhosis Father   . Heart disease Father   . Heart disease Brother   . Colon cancer Neg Hx     History  Substance Use  Topics  . Smoking status: Former Smoker    Types: Cigarettes    Quit date: 06/29/1991  . Smokeless tobacco: Never Used     Comment: stopped 21 yrs ago  . Alcohol Use: 0.5 oz/week    1 drink(s) per week     Comment: occassionally    OBJECTIVE: BP 116/62  Pulse 82  Ht 5' 3.75" (1.619 m)  Wt 128 lb (58.06 kg)  BMI 22.15 kg/m2  LMP 01/26/2010 Constitutional: Well-developed and well-nourished. No distress. HEENT: Normocephalic and atraumatic. Conjunctivae are normal. No scleral icterus. Cardiovascular: Normal rate, regular rhythm and intact distal pulses. No M/R/G Pulmonary/chest: Effort normal and breath sounds normal. No wheezing, rales or rhonchi. Abdominal: Soft, nontender, nondistended. Bowel sounds active throughout. There are no masses palpable. No hepatosplenomegaly. Extremities: no clubbing, cyanosis, or edema Neurological: Alert and oriented to person place and time. Skin: Skin is warm and dry. No rashes noted. Psychiatric: Normal mood and affect. Behavior is normal.  Labs -- pending  ASSESSMENT AND PLAN: 57 yo female with PMH of UC and hypothyroidism who presents for follow-up of left-sided UC.   1. Left-sided ulcerative colitis -- initially her colitis had been confined only to the rectum, but as of December 2012 it was documented to involve the left colon.  Currently her colitis  is in remission without evidence for active disease. We discussed the nature of also colitis, including that this is a chronic process and tends to flare over time. I do think she is more likely to maintain remission with daily mesalamine use. We have discussed how using this medication only 2-3 days weekly may lead to more frequent flaring. That being said, she has done well for about 6 months now taking it less than daily.  She is aware of my recommendation for daily use. We will check a chemistry panel today to check renal function. I recommended follow renal function annually while on 5-ASA  medications. We discussed surveillance colonoscopy and how it relates to IBD. Proctitis alone is not felt to lead to increased risk of colorectal cancer, however guidelines recommend surveillance colonoscopy every one to 2 years for patients with left-sided ulcerative colitis after 10-15 years.  Given that her disease has only been left-sided since 2012, surveillance colonoscopy on a 1-2 year basis is not yet indicated.    2. Iron def anemia -- resolved. We will check CBC today.  3.  CRC screening -- see #1. She reports her last full colonoscopy was 2009 without polyps, therefore she would be due repeat 2019 based on this data  She will return in one year or sooner if necessary.

## 2013-01-08 NOTE — Patient Instructions (Addendum)
Your physician has requested that you go to the basement for the following lab work before leaving today: CBC, BMET  We have sent the following medications to your pharmacy for you to pick up at your convenience: Apriso  Follow up with dr. Hilarie Fredrickson in 1 year

## 2013-08-28 ENCOUNTER — Other Ambulatory Visit: Payer: Self-pay | Admitting: Internal Medicine

## 2014-03-23 ENCOUNTER — Other Ambulatory Visit: Payer: Self-pay | Admitting: Internal Medicine

## 2015-03-19 ENCOUNTER — Other Ambulatory Visit: Payer: Self-pay | Admitting: Internal Medicine

## 2015-03-30 ENCOUNTER — Telehealth: Payer: Self-pay | Admitting: Internal Medicine

## 2015-03-30 MED ORDER — MESALAMINE ER 0.375 G PO CP24: ORAL_CAPSULE | ORAL | Status: DC

## 2015-03-30 NOTE — Telephone Encounter (Signed)
Ok to refill until follow-up appt. Stress the importance of this appt and no further rx unless she can establish followup

## 2015-03-30 NOTE — Telephone Encounter (Signed)
Dr Hilarie Fredrickson- Patient last seen over 2 years ago (12/2012) for ulcerative colitis. She was to be on Apriso 4 tablets daily (which it appears she was not taking as so per your H&P). Last rx for Apriso was sent on 03/31/14 (#120 tablets with 2 refills), so she really should have been out after 07/01/14. She has scheduled an office visit 06/15/15 and now wants Apriso until then. Do you want me to go ahead and fill or have her wait until office visit since we have not seen her in quite sometime and she has not been taking her medication as prescribed? Please advise.Marland KitchenMarland KitchenMarland Kitchen

## 2015-03-30 NOTE — Telephone Encounter (Signed)
I have spoken to patient and have advised of the importance of keeping upcoming office visit on 06/15/15 as well as of taking her apriso as prescribed (4 tablets daily). She states "well, Dr Hilarie Fredrickson knows Im not good about taking my medicine." I explained that she still should take 4 tablets daily as prescribed. Rx sent to pharmacy.

## 2015-05-25 ENCOUNTER — Ambulatory Visit: Payer: Federal, State, Local not specified - PPO | Admitting: Internal Medicine

## 2015-06-15 ENCOUNTER — Ambulatory Visit (INDEPENDENT_AMBULATORY_CARE_PROVIDER_SITE_OTHER): Payer: Federal, State, Local not specified - PPO | Admitting: Internal Medicine

## 2015-06-15 ENCOUNTER — Encounter: Payer: Self-pay | Admitting: Internal Medicine

## 2015-06-15 ENCOUNTER — Other Ambulatory Visit (INDEPENDENT_AMBULATORY_CARE_PROVIDER_SITE_OTHER): Payer: Federal, State, Local not specified - PPO

## 2015-06-15 VITALS — BP 102/60 | HR 60 | Ht 63.25 in | Wt 124.2 lb

## 2015-06-15 DIAGNOSIS — K5190 Ulcerative colitis, unspecified, without complications: Secondary | ICD-10-CM | POA: Diagnosis not present

## 2015-06-15 DIAGNOSIS — K5150 Left sided colitis without complications: Secondary | ICD-10-CM | POA: Diagnosis not present

## 2015-06-15 DIAGNOSIS — Z862 Personal history of diseases of the blood and blood-forming organs and certain disorders involving the immune mechanism: Secondary | ICD-10-CM

## 2015-06-15 LAB — CBC WITH DIFFERENTIAL/PLATELET
Basophils Absolute: 0 10*3/uL (ref 0.0–0.1)
Basophils Relative: 0.9 % (ref 0.0–3.0)
EOS PCT: 4.8 % (ref 0.0–5.0)
Eosinophils Absolute: 0.2 10*3/uL (ref 0.0–0.7)
HEMATOCRIT: 37.1 % (ref 36.0–46.0)
HEMOGLOBIN: 12.6 g/dL (ref 12.0–15.0)
LYMPHS ABS: 1.5 10*3/uL (ref 0.7–4.0)
LYMPHS PCT: 34.3 % (ref 12.0–46.0)
MCHC: 33.8 g/dL (ref 30.0–36.0)
MCV: 90.6 fl (ref 78.0–100.0)
Monocytes Absolute: 0.6 10*3/uL (ref 0.1–1.0)
Monocytes Relative: 13.3 % — ABNORMAL HIGH (ref 3.0–12.0)
NEUTROS ABS: 2 10*3/uL (ref 1.4–7.7)
Neutrophils Relative %: 46.7 % (ref 43.0–77.0)
Platelets: 259 10*3/uL (ref 150.0–400.0)
RBC: 4.1 Mil/uL (ref 3.87–5.11)
RDW: 13 % (ref 11.5–15.5)
WBC: 4.3 10*3/uL (ref 4.0–10.5)

## 2015-06-15 LAB — COMPREHENSIVE METABOLIC PANEL
ALT: 17 U/L (ref 0–35)
AST: 19 U/L (ref 0–37)
Albumin: 4.4 g/dL (ref 3.5–5.2)
Alkaline Phosphatase: 78 U/L (ref 39–117)
BUN: 14 mg/dL (ref 6–23)
CHLORIDE: 106 meq/L (ref 96–112)
CO2: 29 mEq/L (ref 19–32)
CREATININE: 0.67 mg/dL (ref 0.40–1.20)
Calcium: 9.8 mg/dL (ref 8.4–10.5)
GFR: 96.32 mL/min (ref >60.00)
Glucose, Bld: 90 mg/dL (ref 70–99)
Potassium: 4.8 mEq/L (ref 3.5–5.1)
Sodium: 142 mEq/L (ref 135–145)
Total Bilirubin: 0.6 mg/dL (ref 0.2–1.2)
Total Protein: 7.2 g/dL (ref 6.0–8.3)

## 2015-06-15 LAB — IBC PANEL
Iron: 123 ug/dL (ref 42–145)
Saturation Ratios: 26.6 % (ref 20.0–50.0)
TRANSFERRIN: 330 mg/dL (ref 212.0–360.0)

## 2015-06-15 LAB — FERRITIN: FERRITIN: 20.6 ng/mL (ref 10.0–291.0)

## 2015-06-15 LAB — HIGH SENSITIVITY CRP: CRP, High Sensitivity: 0.52 mg/L (ref 0.000–5.000)

## 2015-06-15 MED ORDER — MESALAMINE ER 0.375 G PO CP24: ORAL_CAPSULE | ORAL | Status: DC

## 2015-06-15 NOTE — Patient Instructions (Addendum)
Go to the basement for labs today We will send in your Apriso to your pharmacy Follow up in 1 year Your colonoscopy recall is for 9/019, we will mail you a reminder letter

## 2015-06-15 NOTE — Progress Notes (Signed)
   Subjective:    Patient ID: Christine George, female    DOB: 06-Jun-1956, 59 y.o.   MRN: [id number]  HPI Mithra Spano is a 59 year old female with a past medical history of left-sided colitis, initially proctitis only diagnosed in the late 1990s, found to involve the left colon in 2012 who seen for follow-up. She is here alone today. She was last seen in February 2014. She reports she's been doing well. She denies colitis symptoms today. She reports she at times forgets to take her mesalamine and she notices if she forgets for multiple days she will develop very mild symptoms of colitis. For her this is mucus in her stool. She denies blood in her stool. She denies melena. She denies abdominal pain. Denies tenesmus. Reports good appetite. No weight loss. She's having mostly formed stools though occasionally she will go a whole day without having a bowel movement. She denies rashes, new joint pains, eye complaints. No hepatobiliary complaint. She is up-to-date on her mammogram though she does not have primary care. She follows with an endocrinologist for her hypothyroidism. Her mesalamine is Apiso 1.5 g daily   Review of Systems As per history of present illness, otherwise negative  Current Medications, Allergies, Past Medical History, Past Surgical History, Family History and Social History were reviewed in [employer] record.      Objective:   Physical Exam BP 102/60 mmHg  Pulse 60  Ht 5' 3.25" (1.607 m)  Wt 124 lb 4 oz (56.359 kg)  BMI 21.82 kg/m2  LMP 01/26/2010 Constitutional: Well-developed and well-nourished. No distress. HEENT: Normocephalic and atraumatic. Oropharynx is clear and moist. No oropharyngeal exudate. Conjunctivae are normal.  No scleral icterus. Neck: Neck supple. Trachea midline. Cardiovascular: Normal rate, regular rhythm and intact distal pulses. No M/R/G Pulmonary/chest: Effort normal and breath sounds normal. No wheezing, rales or  rhonchi. Abdominal: Soft, nontender, nondistended. Bowel sounds active throughout. There are no masses palpable. No hepatosplenomegaly. Extremities: no clubbing, cyanosis, or edema Lymphadenopathy: No cervical adenopathy noted. Neurological: Alert and oriented to person place and time. Skin: Skin is warm and dry. No rashes noted. Psychiatric: Normal mood and affect. Behavior is normal.  Labs today  Flexible sigmoidoscopy -- 11/11/2011 -- colitis descending colon to rectum, multiple biopsies. Chronic active colitis Colonoscopy to the cecum 08/01/2008 -- formed in [place]. Normal colon to the distal sigmoid. Rectum to distal sigmoid mild to moderate colitis. No polyps.    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  1. Left-sided colitis -- she reports clinical remission when consistent with mesalamine. I have stressed the importance of daily mesalamine therapy in uninterrupted fashion. She will continue Apriso 1.5g daily.  She is reminded to avoid NSAIDs, which she does. Left-sided colitis since 2012, would recommend surveillance colonoscopies for IBD starting in 2022. She will be due screening colonoscopy in 2019. CMP and CRP today --Annual flu vaccine to start when available  2. CRC screening -- no history of colon polyps, repeat screening colonoscopy recommended September 2019  3. History of iron deficiency anemia -- previously resolved, check CBC and iron studies today  Return in one year, sooner if necessary

## 2016-03-14 ENCOUNTER — Other Ambulatory Visit: Payer: Self-pay | Admitting: Internal Medicine

## 2016-04-13 ENCOUNTER — Other Ambulatory Visit: Payer: Self-pay | Admitting: Internal Medicine

## 2016-08-07 ENCOUNTER — Other Ambulatory Visit: Payer: Self-pay | Admitting: Internal Medicine

## 2016-08-08 ENCOUNTER — Telehealth: Payer: Self-pay | Admitting: Internal Medicine

## 2016-08-08 MED ORDER — MESALAMINE ER 0.375 G PO CP24: ORAL_CAPSULE | ORAL | 0 refills | Status: DC

## 2016-08-08 NOTE — Telephone Encounter (Signed)
Prescription for Apriso sent to patient's pharmacy and patient notified to keep appt for any further refills.

## 2016-11-08 ENCOUNTER — Ambulatory Visit (INDEPENDENT_AMBULATORY_CARE_PROVIDER_SITE_OTHER): Payer: Federal, State, Local not specified - PPO | Admitting: Internal Medicine

## 2016-11-08 ENCOUNTER — Encounter: Payer: Self-pay | Admitting: Internal Medicine

## 2016-11-08 ENCOUNTER — Other Ambulatory Visit (INDEPENDENT_AMBULATORY_CARE_PROVIDER_SITE_OTHER): Payer: Federal, State, Local not specified - PPO

## 2016-11-08 VITALS — BP 98/54 | HR 58 | Ht 63.25 in | Wt 126.4 lb

## 2016-11-08 DIAGNOSIS — R110 Nausea: Secondary | ICD-10-CM | POA: Diagnosis not present

## 2016-11-08 DIAGNOSIS — Z8639 Personal history of other endocrine, nutritional and metabolic disease: Secondary | ICD-10-CM

## 2016-11-08 DIAGNOSIS — K5150 Left sided colitis without complications: Secondary | ICD-10-CM

## 2016-11-08 LAB — IBC PANEL
Iron: 83 ug/dL (ref 42–145)
SATURATION RATIOS: 18.9 % — AB (ref 20.0–50.0)
Transferrin: 314 mg/dL (ref 212.0–360.0)

## 2016-11-08 LAB — COMPREHENSIVE METABOLIC PANEL
ALK PHOS: 63 U/L (ref 39–117)
ALT: 14 U/L (ref 0–35)
AST: 16 U/L (ref 0–37)
Albumin: 4.3 g/dL (ref 3.5–5.2)
BILIRUBIN TOTAL: 0.5 mg/dL (ref 0.2–1.2)
BUN: 10 mg/dL (ref 6–23)
CO2: 29 mEq/L (ref 19–32)
Calcium: 9.4 mg/dL (ref 8.4–10.5)
Chloride: 106 mEq/L (ref 96–112)
Creatinine, Ser: 0.67 mg/dL (ref 0.40–1.20)
GFR: 95.85 mL/min (ref >60.00)
GLUCOSE: 97 mg/dL (ref 70–99)
POTASSIUM: 4.1 meq/L (ref 3.5–5.1)
SODIUM: 141 meq/L (ref 135–145)
TOTAL PROTEIN: 6.9 g/dL (ref 6.0–8.3)

## 2016-11-08 LAB — CBC WITH DIFFERENTIAL/PLATELET
BASOS ABS: 0 10*3/uL (ref 0.0–0.1)
Basophils Relative: 0.7 % (ref 0.0–3.0)
EOS ABS: 0.2 10*3/uL (ref 0.0–0.7)
Eosinophils Relative: 3.3 % (ref 0.0–5.0)
HCT: 35.6 % — ABNORMAL LOW (ref 36.0–46.0)
Hemoglobin: 12.2 g/dL (ref 12.0–15.0)
LYMPHS ABS: 1.4 10*3/uL (ref 0.7–4.0)
Lymphocytes Relative: 29.3 % (ref 12.0–46.0)
MCHC: 34.1 g/dL (ref 30.0–36.0)
MCV: 90.5 fl (ref 78.0–100.0)
MONO ABS: 0.5 10*3/uL (ref 0.1–1.0)
Monocytes Relative: 11.3 % (ref 3.0–12.0)
NEUTROS PCT: 55.4 % (ref 43.0–77.0)
Neutro Abs: 2.6 10*3/uL (ref 1.4–7.7)
Platelets: 284 10*3/uL (ref 150.0–400.0)
RBC: 3.93 Mil/uL (ref 3.87–5.11)
RDW: 13.9 % (ref 11.5–15.5)
WBC: 4.7 10*3/uL (ref 4.0–10.5)

## 2016-11-08 LAB — FERRITIN: Ferritin: 26 ng/mL (ref 10.0–291.0)

## 2016-11-08 LAB — HIGH SENSITIVITY CRP: CRP, High Sensitivity: 0.63 mg/L (ref 0.000–5.000)

## 2016-11-08 MED ORDER — MESALAMINE ER 0.375 G PO CP24
ORAL_CAPSULE | ORAL | 2 refills | Status: DC
Start: 2016-11-08 — End: 2017-10-24

## 2016-11-08 NOTE — Progress Notes (Signed)
   Subjective:    Patient ID: Christine George, female    DOB: Apr 17, 1956, 60 y.o.   MRN: [id number]  HPI Jeyli Zwicker is a 60 year old female with history of left-sided colitis diagnosed in the late 90s (proctitis initially), found to involve the left colon 2012 who is here for follow-up. She was last seen in July 2016. She's been maintained on Apriso 1.5 g daily. She reports her colitis symptoms have been under good control. Bowel habits are not always consistent but she denies blood in her stool, melena, mucus in stool, abdominal pain, rectal pain and tenesmus. Stools are usually formed but not always occurring daily. No fevers, chills. No joint pains. No oral ulcers. No rashes. She has noticed some mild nausea and queasiness in the morning which does not persist. No heartburn, dysphagia or odynophagia. On questioning she is using Advil at bedtime which helps her sleep. This ends up being usually Advil PM. Previously she had felt that Benadryl has a paradoxical effect. She was unaware that Advil PM contained diphenhydramine or Benadryl.  Review of Systems As per history of present illness, otherwise negative  Current Medications, Allergies, Past Medical History, Past Surgical History, Family History and Social History were reviewed in [employer] record.     Objective:   Physical Exam BP (!) 98/54   Pulse (!) 58   Ht 5' 3.25" (1.607 m)   Wt 126 lb 6 oz (57.3 kg)   LMP 01/26/2010   BMI 22.21 kg/m  Constitutional: Well-developed and well-nourished. No distress. HEENT: Normocephalic and atraumatic. Oropharynx is clear and moist. No oropharyngeal exudate. Conjunctivae are normal.  No scleral icterus. Neck: Neck supple. Trachea midline. Cardiovascular: Normal rate, regular rhythm and intact distal pulses. No M/R/G Pulmonary/chest: Effort normal and breath sounds normal. No wheezing, rales or rhonchi. Abdominal: Soft, nontender, nondistended. Bowel sounds active throughout.  There are no masses palpable. No hepatosplenomegaly. Extremities: no clubbing, cyanosis, or edema Lymphadenopathy: No cervical adenopathy noted. Neurological: Alert and oriented to person place and time. Skin: Skin is warm and dry. No rashes noted. Psychiatric: Normal mood and affect. Behavior is normal.  Flexible sigmoidoscopy -- 11/11/2011 -- colitis descending colon to rectum, multiple biopsies. Chronic active colitis Colonoscopy to the cecum 08/01/2008 -- formed in [place]. Normal colon to the distal sigmoid. Rectum to distal sigmoid mild to moderate colitis. No polyps.    Assessment & Plan:  60 year old female with history of left-sided colitis diagnosed in the late 90s (proctitis initially), found to involve the left colon 2012 who is here for follow-up.  1. Left-sided colitis -- clinical remission. Continue Apriso 1.5 g daily. Avoid NSAIDs, see #2. Colonoscopy recommended in 2019. Check CBC, CMP and CRP today.  2. Mild nausea in the morning -- likely NSAID related gastritis or gastropathy. I've asked that she discontinue NSAIDs. She can use Tylenol PM or low-dose Benadryl to help with sleep. If ineffective she can discuss this further with primary care. If symptoms fail to improve she is asked to notify me. She voices understanding. We discussed addition of low-dose PPI but she prefers to stop NSAIDs first to see if symptoms persist.  3. CRC screening -- recommend repeat colonoscopy September 2019  4. History of iron deficiency anemia -- check iron studies today along with CBC as discussed in #1  One year follow-up, sooner if necessary 25 minutes spent with the patient today. Greater than 50% was spent in counseling and coordination of care with the patient

## 2016-11-08 NOTE — Patient Instructions (Addendum)
Your physician has requested that you go to the basement for the following lab work before leaving today: CBC, IBC, Ferritin, CRP, CMP  We have sent the following medications to your pharmacy for you to pick up at your convenience: Apriso 1.5 grams daily  Discontinue Advil PM. You may instead take benadryl (diphenhydramine) or Tylenol PM.  Follow up with Dr Hilarie Fredrickson in 1 year.  If you are age 58 or older, your body mass index should be between 23-30. Your Body mass index is 22.21 kg/m. If this is out of the aforementioned range listed, please consider follow up with your Primary Care Provider.  If you are age 61 or younger, your body mass index should be between 19-25. Your Body mass index is 22.21 kg/m. If this is out of the aformentioned range listed, please consider follow up with your Primary Care Provider.

## 2017-10-24 ENCOUNTER — Telehealth: Payer: Self-pay | Admitting: Internal Medicine

## 2017-10-24 MED ORDER — MESALAMINE ER 0.375 G PO CP24: ORAL_CAPSULE | ORAL | 0 refills | Status: DC

## 2017-10-24 NOTE — Telephone Encounter (Signed)
Rx sent 

## 2017-12-26 ENCOUNTER — Other Ambulatory Visit (INDEPENDENT_AMBULATORY_CARE_PROVIDER_SITE_OTHER): Payer: Federal, State, Local not specified - PPO

## 2017-12-26 ENCOUNTER — Encounter: Payer: Self-pay | Admitting: Internal Medicine

## 2017-12-26 ENCOUNTER — Telehealth: Payer: Self-pay | Admitting: *Deleted

## 2017-12-26 ENCOUNTER — Ambulatory Visit: Payer: Federal, State, Local not specified - PPO | Admitting: Internal Medicine

## 2017-12-26 VITALS — BP 90/62 | HR 68 | Ht 63.25 in | Wt 122.0 lb

## 2017-12-26 DIAGNOSIS — K5150 Left sided colitis without complications: Secondary | ICD-10-CM

## 2017-12-26 DIAGNOSIS — Z1211 Encounter for screening for malignant neoplasm of colon: Secondary | ICD-10-CM | POA: Diagnosis not present

## 2017-12-26 DIAGNOSIS — Z7689 Persons encountering health services in other specified circumstances: Secondary | ICD-10-CM | POA: Diagnosis not present

## 2017-12-26 DIAGNOSIS — Z8719 Personal history of other diseases of the digestive system: Secondary | ICD-10-CM | POA: Diagnosis not present

## 2017-12-26 LAB — CBC WITH DIFFERENTIAL/PLATELET
BASOS ABS: 0 10*3/uL (ref 0.0–0.1)
BASOS PCT: 0.5 % (ref 0.0–3.0)
EOS PCT: 6.1 % — AB (ref 0.0–5.0)
Eosinophils Absolute: 0.2 10*3/uL (ref 0.0–0.7)
HEMATOCRIT: 35.1 % — AB (ref 36.0–46.0)
Hemoglobin: 12 g/dL (ref 12.0–15.0)
LYMPHS PCT: 31.3 % (ref 12.0–46.0)
Lymphs Abs: 1.3 10*3/uL (ref 0.7–4.0)
MCHC: 34.1 g/dL (ref 30.0–36.0)
MCV: 89.5 fl (ref 78.0–100.0)
MONOS PCT: 8.7 % (ref 3.0–12.0)
Monocytes Absolute: 0.4 10*3/uL (ref 0.1–1.0)
NEUTROS ABS: 2.2 10*3/uL (ref 1.4–7.7)
Neutrophils Relative %: 53.4 % (ref 43.0–77.0)
Platelets: 263 10*3/uL (ref 150.0–400.0)
RBC: 3.93 Mil/uL (ref 3.87–5.11)
RDW: 13.3 % (ref 11.5–15.5)
WBC: 4.1 10*3/uL (ref 4.0–10.5)

## 2017-12-26 LAB — COMPREHENSIVE METABOLIC PANEL
ALT: 20 U/L (ref 0–35)
AST: 19 U/L (ref 0–37)
Albumin: 4.1 g/dL (ref 3.5–5.2)
Alkaline Phosphatase: 64 U/L (ref 39–117)
BUN: 15 mg/dL (ref 6–23)
CHLORIDE: 106 meq/L (ref 96–112)
CO2: 29 meq/L (ref 19–32)
Calcium: 9.3 mg/dL (ref 8.4–10.5)
Creatinine, Ser: 0.68 mg/dL (ref 0.40–1.20)
GFR: 93.86 mL/min (ref >60.00)
Glucose, Bld: 88 mg/dL (ref 70–99)
Potassium: 4 mEq/L (ref 3.5–5.1)
Sodium: 140 mEq/L (ref 135–145)
Total Bilirubin: 0.5 mg/dL (ref 0.2–1.2)
Total Protein: 7 g/dL (ref 6.0–8.3)

## 2017-12-26 LAB — HIGH SENSITIVITY CRP: CRP HIGH SENSITIVITY: 1.32 mg/L (ref 0.000–5.000)

## 2017-12-26 MED ORDER — SUPREP BOWEL PREP KIT 17.5-3.13-1.6 GM/177ML PO SOLN: 1.0000 | ORAL | 0 refills | Status: AC

## 2017-12-26 MED ORDER — MESALAMINE ER 0.375 G PO CP24: ORAL_CAPSULE | ORAL | 3 refills | Status: AC

## 2017-12-26 MED ORDER — VSL#3 DS PO PACK: 1.0000 | PACK | Freq: Every day | ORAL | 10 refills | Status: AC

## 2017-12-26 NOTE — Telephone Encounter (Signed)
I have contacted patient to advise of appointment made with Dr Sheppard Coil at [facility] on 01/02/18. Patient states she did not realize that we were actually making an appointment and wishes for [place] to cancel this appointment. She thought we were just going to give her information on PCP. I have contacted Luellen Pucker at [facility] to cancel appointment. Phone number provided to patient in the case she does choose to reschedule (phone [phone]).

## 2017-12-26 NOTE — Patient Instructions (Addendum)
Your physician has requested that you go to the basement for the following lab work before leaving today: CBC, CMP, CRP  You have been scheduled for a colonoscopy. Please follow written instructions given to you at your visit today.  Please pick up your prep supplies at the pharmacy within the next 1-3 days. If you use inhalers (even only as needed), please bring them with you on the day of your procedure. Your physician has requested that you go to www.startemmi.com and enter the access code given to you at your visit today. This web site gives a general overview about your procedure. However, you should still follow specific instructions given to you by our office regarding your preparation for the procedure.  Please follow up with Dr Hilarie Fredrickson in the office in 1 year.  We have sent the following medications to your pharmacy for you to pick up at your convenience: Apriso  1.5 grams daily VSL #3 DS- Take 1-2 packets daily   You have been scheduled for an appointment with Dr Sheppard Coil at [facility], [address]) on Tuesday, 01/02/18 at 910 am with an 850 am arrival.  If you are age 49 or older, your body mass index should be between 23-30. Your Body mass index is 21.44 kg/m. If this is out of the aforementioned range listed, please consider follow up with your Primary Care Provider.  If you are age 31 or younger, your body mass index should be between 19-25. Your Body mass index is 21.44 kg/m. If this is out of the aformentioned range listed, please consider follow up with your Primary Care Provider.

## 2017-12-26 NOTE — Progress Notes (Signed)
   Subjective:    Patient ID: Christine George, female    DOB: 1956/04/18, 62 y.o.   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  She was last seen on 11/08/2016.  She is been maintained on Apriso 1.5 g daily.  She reports her colitis symptoms have been under good control.  She is noticed a slight feeling of incomplete evacuation and a very slight urgency though reports her bowel movements are not uncontrolled.  She is stop drinking coffee and switch to drinking lemon juice for health reasons.  She feels that she may be going slightly more frequently to the bathroom now going 1 or 2 times per day having previously gone 3 or 4 days/week.  She denies blood in her stool, abdominal pain, rectal pain and mucus in her stool.  No trouble with the Apriso at 1.5 g daily.  No new rashes, joint pains or ocular complaint.  She denies upper GI complaint including heartburn, dysphagia, odynophagia.  No hepatobiliary complaint.  Her last full colonoscopy was in 2009 at which point there was mild to moderate ulcerative colitis seen in the distal sigmoid and rectum to about 25 mm.  No polyps.  She had a flex sig in 2012.   Review of Systems As per HPI, otherwise negative  Current Medications, Allergies, Past Medical History, Past Surgical History, Family History and Social History were reviewed in [employer] record.      Objective:   Physical Exam  BP 90/62   Pulse 68   Ht 5' 3.25" (1.607 m)   Wt 122 lb (55.3 kg)   LMP 01/26/2010   BMI 21.44 kg/m  Constitutional: Well-developed and well-nourished. No distress. HEENT: Normocephalic and atraumatic.  Conjunctivae are normal.  No scleral icterus. Neck: Neck supple. Trachea midline. Cardiovascular: Normal rate, regular rhythm and intact distal pulses. No  M/R/G Pulmonary/chest: Effort normal and breath sounds normal. No wheezing, rales or rhonchi. Abdominal: Soft, nontender, nondistended. Bowel sounds active throughout. There are no masses palpable. No hepatosplenomegaly. Extremities: no clubbing, cyanosis, or edema Neurological: Alert and oriented to person place and time. Skin: Skin is warm and dry. Psychiatric: Normal mood and affect. Behavior is normal.      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  1. Left-sided colitis --some slight increase in bowel frequency without blood, diarrhea, or abdominal pain.  Continue Apriso 1.5 g daily.  Check CBC, CMP and CRP today.  I recommended colonoscopy for screening and to assess disease activity.  We discussed the risk, benefits and alternatives and she is agreeable to proceed.  We discussed guidelines that would support surveillance colonoscopies more frequently after 12 years of disease which she is very close to.  She is opposed to frequent colonoscopy but we can talk about this more and the future. --I also recommended VSL 3 1-2 double strength packets per day to help with her colitis  2.  Primary care --need for primary care.  Referral placed.  Annual office followup 25 minutes spent with the patient today. Greater than 50% was spent in counseling and coordination of care with the patient

## 2018-01-02 ENCOUNTER — Ambulatory Visit: Payer: Federal, State, Local not specified - PPO | Admitting: Osteopathic Medicine

## 2018-02-05 ENCOUNTER — Telehealth: Payer: Self-pay | Admitting: Internal Medicine

## 2018-02-05 ENCOUNTER — Encounter: Payer: Federal, State, Local not specified - PPO | Admitting: Internal Medicine

## 2018-02-05 ENCOUNTER — Telehealth: Payer: Self-pay | Admitting: *Deleted

## 2018-02-05 NOTE — Telephone Encounter (Signed)
Patient states that she was able to keep the evening dose of prep down yesterday but woke up this a.m. Throwing up after drinking liquids.  This is unusual for her.  She has to start her second prep and she is really nauseated.  She states that she will do her best to get this down but already feels like she is going to throw it up.  I will speak with Dr. Hilarie Fredrickson and give patient a call back.  I advised her to start drinking it and do the best she can and I will call her after speaking with Dr. Hilarie Fredrickson.  B.Sinead Hockman, CMA   Spoke with Dr. Hilarie Fredrickson and he advised patient to take Reglan 10 mg 30 mins prior to second dose of Prep or reschedule using a different prep with Reglan 30 minutes before each does. I called patient and gave her the options.  She states that she doesn't have anyone to go and get the Rx.and she doesn't want to reschedule due to her taking the day off from work.  She states she will try and get the second half down and call us within an hour and let us know how she is doing.  I gave direct number to admitting.  B.Mercury [place], CMA

## 2018-02-05 NOTE — Telephone Encounter (Signed)
Per Dr. Hilarie Fredrickson  Call patient and see if she would like to try the Miralax 7-8 doses in 32 oz gatorade with Reglan 10 mg 30 minutes before.  I called patient and she states she just ate 2 mins ago.  I advised that since she has eaten, we would not be able to do her procedure today.   She seemed to be in better spirits and understanding.  Advised her to address this issue when she comes in for her Pre visit and that I have a detailed note stating her difficulty with Suprep.  B.Colsen Modi, CMA

## 2018-02-05 NOTE — Telephone Encounter (Signed)
Patient called back and states that she was able to get the second half of prep down but threw it up minutes later.  I advised her that we would need to reschedule and she states that she will not be able to schedule until the end of the year due to her having to take vacation.  I advised her to call back to reschedule when she can and we will give her a different prep with Reglan 10 mg 30 minutes prior to each dose.  It was very difficult to make her understand and to make a decision on what she wanted (needed) to do.    B.Orvin Netter, CMA

## 2018-06-25 ENCOUNTER — Encounter

## 2018-06-25 ENCOUNTER — Ambulatory Visit: Payer: Federal, State, Local not specified - PPO | Admitting: Physician Assistant

## 2018-07-24 ENCOUNTER — Ambulatory Visit: Payer: Federal, State, Local not specified - PPO | Admitting: Internal Medicine

## 2018-12-17 ENCOUNTER — Other Ambulatory Visit: Payer: Self-pay

## 2018-12-17 DIAGNOSIS — K5190 Ulcerative colitis, unspecified, without complications: Secondary | ICD-10-CM

## 2019-10-15 ENCOUNTER — Other Ambulatory Visit: Payer: Self-pay

## 2019-10-15 ENCOUNTER — Emergency Department (INDEPENDENT_AMBULATORY_CARE_PROVIDER_SITE_OTHER): Payer: Federal, State, Local not specified - PPO

## 2019-10-15 ENCOUNTER — Emergency Department (INDEPENDENT_AMBULATORY_CARE_PROVIDER_SITE_OTHER)
Admission: EM | Admit: 2019-10-15 | Discharge: 2019-10-15 | Disposition: A | Discharge status: Home / Self Care | Payer: Federal, State, Local not specified - PPO | Source: Home / Self Care | Attending: Family Medicine | Admitting: Family Medicine

## 2019-10-15 DIAGNOSIS — M26622 Arthralgia of left temporomandibular joint: Secondary | ICD-10-CM | POA: Diagnosis not present

## 2019-10-15 DIAGNOSIS — R519 Headache, unspecified: Secondary | ICD-10-CM | POA: Diagnosis not present

## 2019-10-15 MED ORDER — PREDNISONE 20 MG PO TABS: ORAL_TABLET | ORAL | 0 refills | Status: AC

## 2019-10-15 NOTE — Discharge Instructions (Addendum)
Apply ice pack for 15 to 20 minutes, 2 to 3 times daily  Continue until pain and swelling decrease.  May try heating pad if there is no response to ice for several days.

## 2019-10-15 NOTE — ED Triage Notes (Signed)
Pt c/o jaw pain x 2 weeks. Feels a lump in RT side of jaw. Sinus pain. No otc meds taken. Shooting pains this morning. Hurts worse when eating.

## 2019-10-15 NOTE — ED Provider Notes (Signed)
[facility]    CSN: [id number] Arrival date & time: 10/15/19  1602      History   Chief Complaint Chief Complaint  Patient presents with  . Jaw Pain    HPI Christine George is a 63 y.o. female.   Patient complains of several year history of vague pain in her left ear.  It sometimes feels as if it is clogged, and she feels like her hearing has been decreased in the left ear for about 2 years.  She notes that she sometimes has pain in her left ear when she chews, and her left jaw feels as if it may "lock."  She has also noticed localized pain in her left facial area several weeks ago, having visited a dentist about two weeks ago to rule out dental etiology (dental evaluation was negative).  She wonders if she might have a sinus infection, but denies nasal congestion.  She also denies fevers, chills, and sweats.  The history is provided by the patient.  Otalgia Location:  Left Behind ear:  Swelling Quality:  Aching Severity:  Mild Onset quality:  Gradual Timing:  Intermittent Progression:  Waxing and waning Chronicity:  Chronic Context: not direct blow, not foreign body in ear, not loud noise, not recent URI and not water in ear   Relieved by:  Nothing Exacerbated by: chewing. Ineffective treatments:  None tried Associated symptoms: hearing loss   Associated symptoms: no congestion, no cough, no ear discharge, no fever, no headaches, no neck pain, no rash, no rhinorrhea, no sore throat and no tinnitus     Past Medical History:  Diagnosis Date  . Anemia   . Rectal bleeding   . Thyroid disease   . Ulcerative colitis     Patient Active Problem List   Diagnosis Date Noted  . Iron deficiency 08/17/2012  . Ulcerative colitis, left sided ([place]) 08/09/2011  . Hypothyroidism 08/09/2011    Past Surgical History:  Procedure Laterality Date  . BREAST LUMPECTOMY     right  . CESAREAN SECTION    . COLONOSCOPY      OB History   No obstetric history on file.       Home Medications    Prior to Admission medications   Medication Sig Start Date End Date Taking? Authorizing Provider  thyroid (ARMOUR) 120 MG tablet Take 120 mg by mouth daily before breakfast.   Yes [provider]  calcium carbonate 200 MG capsule Take 250 mg by mouth 2 (two) times daily with a meal.      [provider]  cholecalciferol (VITAMIN D) 400 UNITS TABS Take by mouth daily.      [provider]  mesalamine (APRISO) 0.375 g 24 hr capsule TAKE 4 CAPSULES BY MOUTH EVERY DAY 12/26/17   Pyrtle, Lajuan Lines, MD  Multiple Vitamin (MULTIVITAMIN) tablet Take 1 tablet by mouth daily.      [provider]  predniSONE (DELTASONE) 20 MG tablet Take one tab by mouth twice daily for 4 days, then one daily. Take with food. 10/15/19   Kandra Nicolas, MD  Probiotic Product (VSL#3 DS) PACK Take 1-2 each by mouth daily. For ulcerative colitis 12/26/17   Pyrtle, Lajuan Lines, MD  SUPREP BOWEL PREP KIT 17.5-3.13-1.6 GM/177ML SOLN Take 1 kit by mouth as directed. 12/26/17   Pyrtle, Lajuan Lines, MD  valACYclovir (VALTREX) 1000 MG tablet  08/16/11   [provider]    Family History Family History  Problem Relation Age of  Onset  . Cirrhosis Father   . Heart disease Father   . Heart disease Brother   . Colon cancer Neg Hx     Social History Social History   Tobacco Use  . Smoking status: Former Smoker    Types: Cigarettes    Quit date: 06/29/1991    Years since quitting: 28.3  . Smokeless tobacco: Never Used  . Tobacco comment: stopped 21 yrs ago  Substance Use Topics  . Alcohol use: Yes    Alcohol/week: 1.0 standard drinks    Types: 1 drink(s) per week    Comment: occassionally  . Drug use: No     Allergies   Patient has no known allergies.   Review of Systems Review of Systems  Constitutional: Negative for chills, diaphoresis, fatigue and fever.  HENT: Positive for ear pain and hearing loss. Negative for congestion, ear discharge, rhinorrhea, sore  throat and tinnitus.   Respiratory: Negative for cough.   Musculoskeletal: Negative for neck pain.  Skin: Negative for rash.  Neurological: Negative for headaches.  All other systems reviewed and are negative.    Physical Exam Triage Vital Signs ED Triage Vitals  Enc Vitals Group     BP 10/15/19 1617 105/68     Pulse Rate 10/15/19 1617 70     Resp 10/15/19 1617 18     Temp 10/15/19 1617 98.1 F (36.7 C)     Temp Source 10/15/19 1617 Oral     SpO2 10/15/19 1617 99 %     Weight 10/15/19 1618 116 lb (52.6 kg)     Height 10/15/19 1618 5' 3.75" (1.619 m)     Head Circumference --      Peak Flow --      Pain Score 10/15/19 1617 2     Pain Loc --      Pain Edu? --      Excl. in [place]? --    No data found.  Updated Vital Signs BP 105/68 (BP Location: Right Arm)   Pulse 70   Temp 98.1 F (36.7 C) (Oral)   Resp 18   Ht 5' 3.75" (1.619 m)   Wt 52.6 kg   LMP 01/26/2010   SpO2 99%   BMI 20.07 kg/m   Visual Acuity Right Eye Distance:   Left Eye Distance:   Bilateral Distance:    Right Eye Near:   Left Eye Near:    Bilateral Near:     Physical Exam Vitals signs and nursing note reviewed.  Constitutional:      General: She is not in acute distress.    Appearance: She is normal weight.  HENT:     Head: Normocephalic.      Comments: Left cheek has vague tenderness to deep palpation at location as noted on diagram.  There is no overlying erythema, swelling, or warmth.  No tenderness or enlargement of the left parotid gland.    Right Ear: Tympanic membrane, ear canal and external ear normal. There is no impacted cerumen.     Left Ear: Tympanic membrane, ear canal and external ear normal. There is no impacted cerumen.     Ears:     Comments: There is distinct tenderness over the left temporomandibular joint.  Palpation there recreates her pain.  There is mild tenderness over the post-auricular node.     Nose: Nose normal. No congestion.     Mouth/Throat:     Pharynx:  Oropharynx is clear.  Eyes:     Conjunctiva/sclera: Conjunctivae  normal.     Pupils: Pupils are equal, round, and reactive to light.  Cardiovascular:     Rate and Rhythm: Normal rate.  Pulmonary:     Effort: Pulmonary effort is normal.  Lymphadenopathy:     Cervical: No cervical adenopathy.  Skin:    General: Skin is warm and dry.     Findings: No rash.  Neurological:     Mental Status: She is alert.      UC Treatments / Results  Labs (all labs ordered are listed, but only abnormal results are displayed) Labs Reviewed -   Tympanometry:  Right ear tympanogram normal; Left ear tympanogram normal  EKG   Radiology Dg Sinuses Complete  Result Date: 10/15/2019 CLINICAL DATA:  Left facial pain EXAM: PARANASAL SINUSES - COMPLETE 3 + VIEW COMPARISON:  None. FINDINGS: The paranasal sinus are aerated. There is no evidence of sinus opacification air-fluid levels or mucosal thickening. No significant bone abnormalities are seen. IMPRESSION: Negative. Electronically Signed   By: Davina Poke M.D.   On: 10/15/2019 17:28    Procedures Procedures (including critical care time)  Medications Ordered in UC Medications - No data to display  Initial Impression / Assessment and Plan / UC Course  I have reviewed the triage vital signs and the nursing notes.  Pertinent labs & imaging results that were available during my care of the patient were reviewed by me and considered in my medical decision making (see chart for details).    No evidence sinusitis or otitis media.  Unknown etiology of point tenderness over left maxilla. Rx for prednisone burst/taper.  Recommend follow-up with ENT for further evaluation.    Final Clinical Impressions(s) / UC Diagnoses   Final diagnoses:  Arthralgia of left temporomandibular joint     Discharge Instructions     Apply ice pack for 15 to 20 minutes, 2 to 3 times daily  Continue until pain and swelling decrease.  May try heating pad if there  is no response to ice for several days.    ED Prescriptions    Medication Sig Dispense Auth. Provider   predniSONE (DELTASONE) 20 MG tablet Take one tab by mouth twice daily for 4 days, then one daily. Take with food. 12 tablet Kandra Nicolas, MD        Kandra Nicolas, MD 10/15/19 Carollee Massed

## 2021-01-27 IMAGING — DX DG SINUSES COMPLETE 3+V
3 series · 3 of 3 positions shown · non-contrast
Comparison: None.

CLINICAL DATA: Left facial pain

EXAM:
PARANASAL SINUSES - COMPLETE 3 + VIEW

[pns waters]
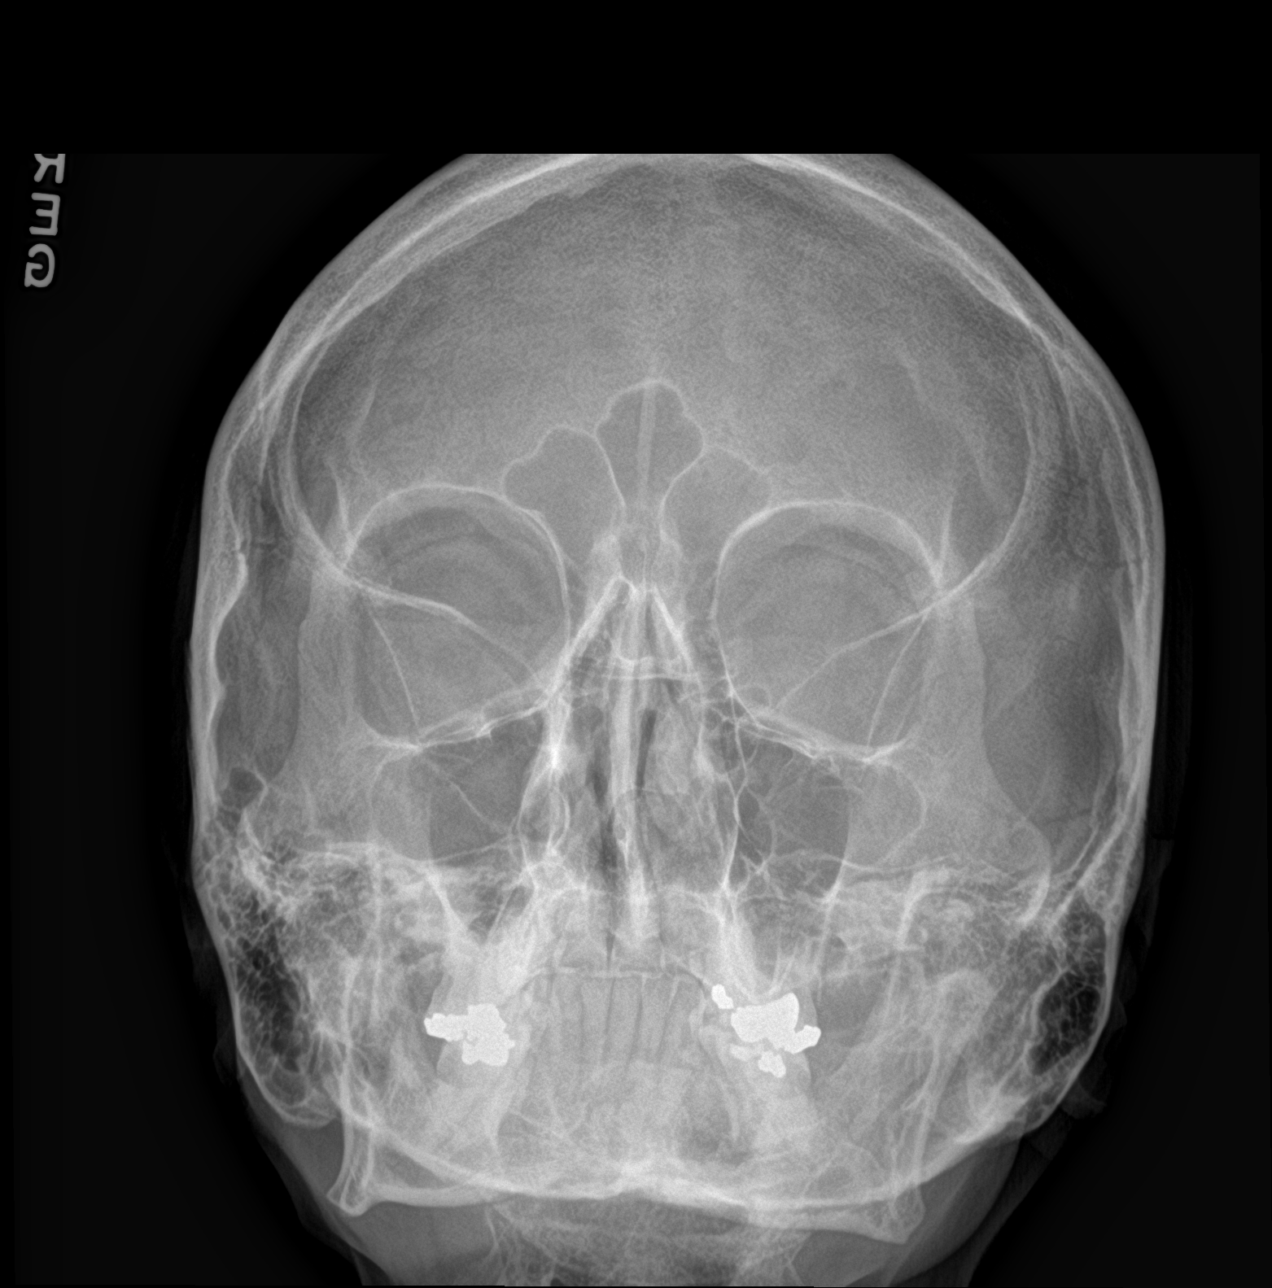

[[person_name]]
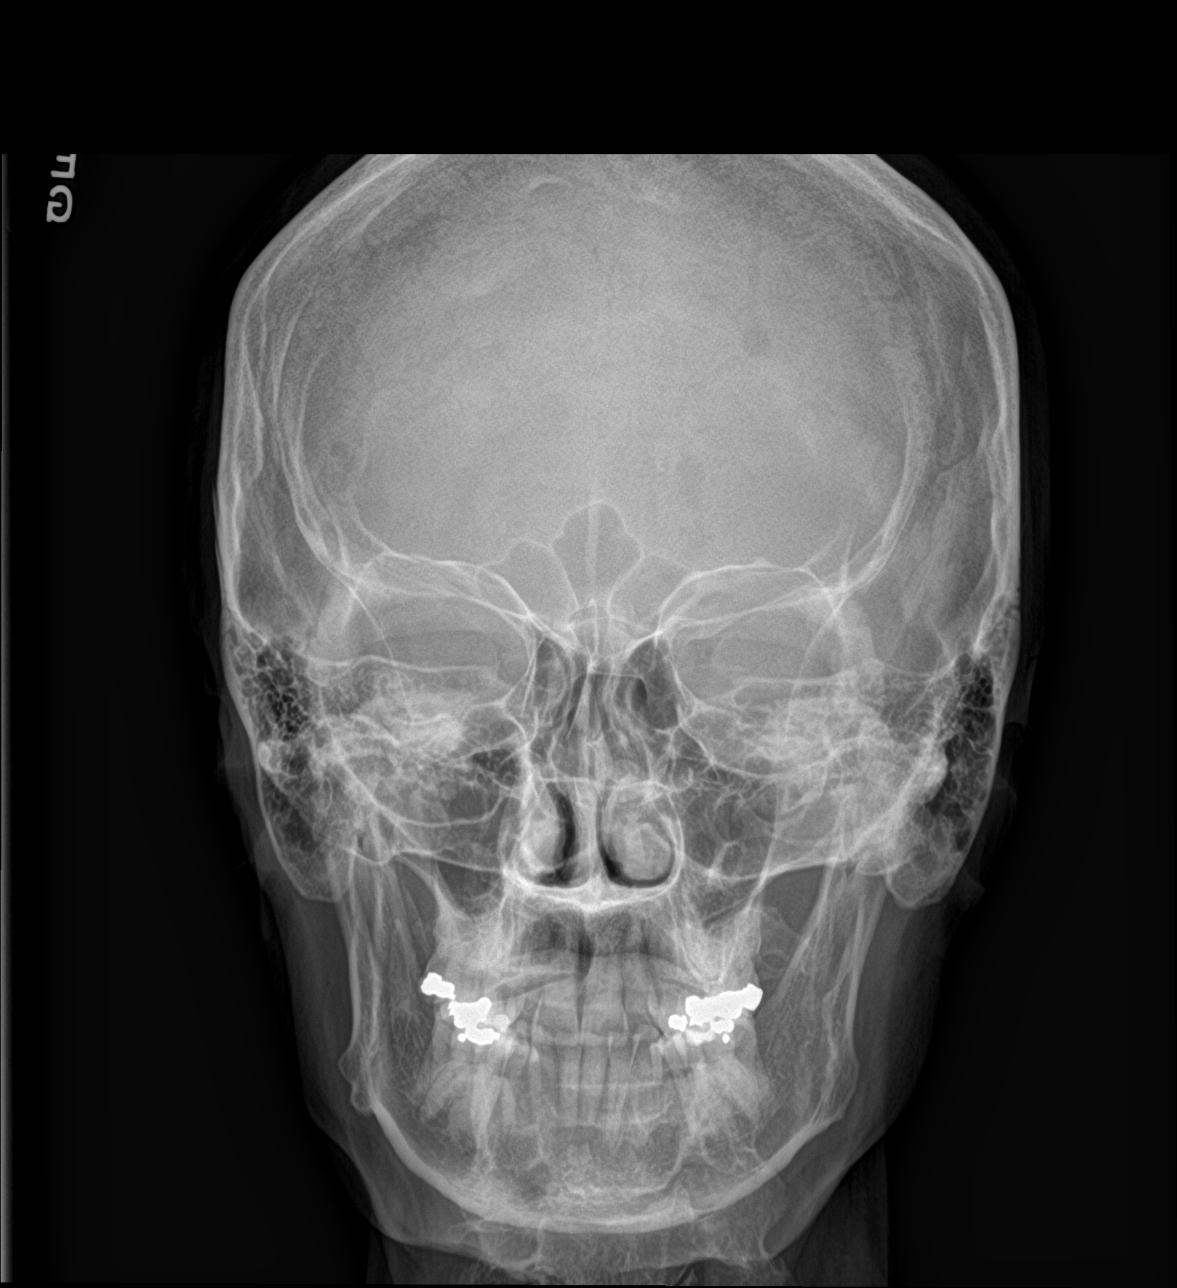

[pns lat]
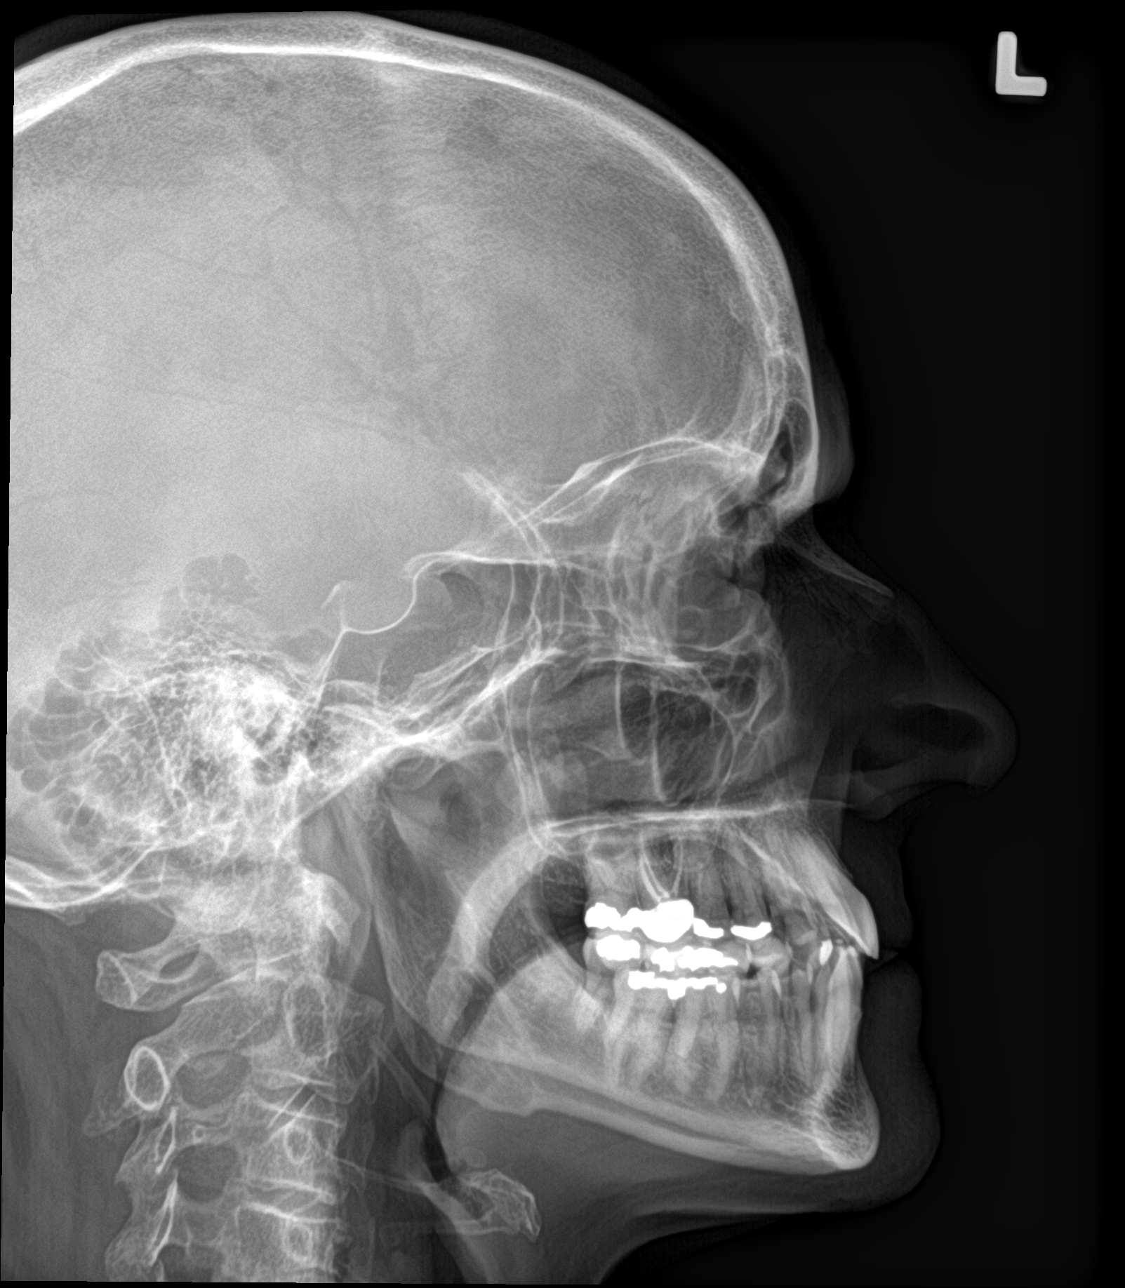

[3 of 3 positions shown; findings below may reference images not displayed]

FINDINGS: The paranasal sinus are aerated. There is no evidence of sinus
opacification air-fluid levels or mucosal thickening. No significant
bone abnormalities are seen.
IMPRESSION: Negative.
